# Patient Record
Sex: Male | Born: 2000 | Hispanic: No | Marital: Single | Smoking: Never smoker
Health system: Southern US, Community
[De-identification: ages and names within clinical notes are randomized; demographics above are authoritative.]

## PROBLEM LIST (undated history)

## (undated) DIAGNOSIS — J45909 Unspecified asthma, uncomplicated: Secondary | ICD-10-CM

## (undated) DIAGNOSIS — T7840XA Allergy, unspecified, initial encounter: Secondary | ICD-10-CM

## (undated) HISTORY — PX: FINGER NAIL SURGERY: SHX717

---
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
## 2023-03-21 ENCOUNTER — Telehealth: Payer: Self-pay | Admitting: Orthopedic Surgery

## 2023-03-21 ENCOUNTER — Encounter (HOSPITAL_COMMUNITY): Payer: Self-pay | Admitting: *Deleted

## 2023-03-21 ENCOUNTER — Other Ambulatory Visit: Payer: Self-pay

## 2023-03-21 ENCOUNTER — Ambulatory Visit (HOSPITAL_COMMUNITY)
Admission: EM | Admit: 2023-03-21 | Discharge: 2023-03-21 | Disposition: A | Discharge status: Home / Self Care | Payer: Medicaid Other | Attending: Family Medicine | Admitting: Family Medicine

## 2023-03-21 ENCOUNTER — Ambulatory Visit (INDEPENDENT_AMBULATORY_CARE_PROVIDER_SITE_OTHER): Payer: Medicaid Other

## 2023-03-21 DIAGNOSIS — S42022A Displaced fracture of shaft of left clavicle, initial encounter for closed fracture: Secondary | ICD-10-CM | POA: Diagnosis not present

## 2023-03-21 DIAGNOSIS — T148XXA Other injury of unspecified body region, initial encounter: Secondary | ICD-10-CM

## 2023-03-21 DIAGNOSIS — S42002A Fracture of unspecified part of left clavicle, initial encounter for closed fracture: Secondary | ICD-10-CM | POA: Diagnosis not present

## 2023-03-21 MED ORDER — TETANUS-DIPHTH-ACELL PERTUSSIS 5-2.5-18.5 LF-MCG/0.5 IM SUSY
0.5000 mL | PREFILLED_SYRINGE | Freq: Once | INTRAMUSCULAR | Status: AC
  Administered 2023-03-21: 0.5 mL via INTRAMUSCULAR

## 2023-03-21 MED ORDER — HYDROCODONE-ACETAMINOPHEN 5-325 MG PO TABS: 1.0000 | ORAL_TABLET | Freq: Four times a day (QID) | ORAL | 0 refills | Status: DC | PRN

## 2023-03-21 MED ORDER — KETOROLAC TROMETHAMINE 30 MG/ML IJ SOLN: 30.0000 mg | Freq: Once | INTRAMUSCULAR | Status: DC

## 2023-03-21 MED ORDER — TETANUS-DIPHTH-ACELL PERTUSSIS 5-2.5-18.5 LF-MCG/0.5 IM SUSY
PREFILLED_SYRINGE | INTRAMUSCULAR | Status: AC
  Filled 2023-03-21: qty 0.5
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
## 2023-03-21 NOTE — Discharge Instructions (Addendum)
You have a fracture in the middle of your left collarbone.  You may need surgery but you do not have to have it urgently.  Please call the orthopedics office to make an appointment to see them for evaluation.  You have been given a shot of Toradol 30 mg today.  You have been given a Tdap vaccination and booster tetanus immunity  Hydrocodone 5 mg--1 tablet every 6 hours as needed for pain.  This is best taken with food.  It can cause sleepiness or dizziness
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
## 2023-03-21 NOTE — Telephone Encounter (Signed)
Per Dr. Roda Shutters need sx and he wants him to come in the morning. Called and scheduled

## 2023-03-21 NOTE — Telephone Encounter (Signed)
Patient asking if he is needing surgery on his shoulder. Please advise

## 2023-03-22 ENCOUNTER — Ambulatory Visit (INDEPENDENT_AMBULATORY_CARE_PROVIDER_SITE_OTHER): Payer: Medicaid Other | Admitting: Orthopaedic Surgery

## 2023-03-22 DIAGNOSIS — S42022A Displaced fracture of shaft of left clavicle, initial encounter for closed fracture: Secondary | ICD-10-CM | POA: Diagnosis not present

## 2023-03-22 MED ORDER — HYDROCODONE-ACETAMINOPHEN 5-325 MG PO TABS: 1.0000 | ORAL_TABLET | Freq: Four times a day (QID) | ORAL | 0 refills | Status: AC | PRN
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
## 2023-03-22 NOTE — Addendum Note (Signed)
Addended by: Mayra Reel on: 03/22/2023 09:08 AM   Modules accepted: Orders

## 2023-03-23 ENCOUNTER — Other Ambulatory Visit: Payer: Self-pay

## 2023-03-23 ENCOUNTER — Encounter (HOSPITAL_COMMUNITY): Payer: Self-pay | Admitting: Orthopaedic Surgery

## 2023-03-23 NOTE — Progress Notes (Addendum)
Mr.  Steven Blanchard denies chest pain or shortness of breath.  Patient denies having any s/s of Covid in his household, also denies any known exposure to Covid.  Steven Blanchard denies  any s/s of upper or lower respiratory infection in the past 8 weeks. Steven Blanchard does not have a PCP.

## 2023-03-26 NOTE — Anesthesia Preprocedure Evaluation (Addendum)
Anesthesia Evaluation  Patient identified by MRN, date of birth, ID band Patient awake    Reviewed: Allergy & Precautions, NPO status , Patient's Chart, lab work & pertinent test results  History of Anesthesia Complications Negative for: history of anesthetic complications  Airway Mallampati: III  TM Distance: >3 FB Neck ROM: Full   Comment: Facial hair Dental  (+) Dental Advisory Given   Pulmonary neg shortness of breath, asthma (no recent flares, does not use inhalers) , neg sleep apnea, neg COPD, neg recent URI, Patient abstained from smoking.   Pulmonary exam normal breath sounds clear to auscultation       Cardiovascular negative cardio ROS  Rhythm:Regular Rate:Normal     Neuro/Psych negative neurological ROS     GI/Hepatic negative GI ROS,,,(+)     substance abuse (last use on 03/25/2023)  marijuana use  Endo/Other  negative endocrine ROS    Renal/GU negative Renal ROS     Musculoskeletal   Abdominal  (+) + obese  Peds  Hematology negative hematology ROS (+)   Anesthesia Other Findings Vapes  Reproductive/Obstetrics                             Anesthesia Physical Anesthesia Plan  ASA: 2  Anesthesia Plan: General   Post-op Pain Management: Tylenol PO (pre-op)*, Precedex and Ketamine IV*   Induction: Intravenous  PONV Risk Score and Plan: 2 and Ondansetron, Dexamethasone and Treatment may vary due to age or medical condition  Airway Management Planned: Oral ETT  Additional Equipment:   Intra-op Plan:   Post-operative Plan: Extubation in OR  Informed Consent: I have reviewed the patients History and Physical, chart, labs and discussed the procedure including the risks, benefits and alternatives for the proposed anesthesia with the patient or authorized representative who has indicated his/her understanding and acceptance.     Dental advisory given  Plan Discussed  with: CRNA and Anesthesiologist  Anesthesia Plan Comments: (Risks of general anesthesia discussed including, but not limited to, sore throat, hoarse voice, chipped/damaged teeth, injury to vocal cords, nausea and vomiting, allergic reactions, lung infection, heart attack, stroke, and death. All questions answered. )       Anesthesia Quick Evaluation

## 2023-03-27 ENCOUNTER — Other Ambulatory Visit: Payer: Self-pay

## 2023-03-27 ENCOUNTER — Ambulatory Visit (HOSPITAL_COMMUNITY): Payer: Medicaid Other

## 2023-03-27 ENCOUNTER — Ambulatory Visit (HOSPITAL_COMMUNITY)
Admission: RE | Admit: 2023-03-27 | Discharge: 2023-03-27 | Disposition: A | Discharge status: Home / Self Care | Payer: Medicaid Other | Attending: Orthopaedic Surgery | Admitting: Orthopaedic Surgery

## 2023-03-27 ENCOUNTER — Ambulatory Visit (HOSPITAL_COMMUNITY): Payer: Medicaid Other | Admitting: Anesthesiology

## 2023-03-27 ENCOUNTER — Ambulatory Visit (HOSPITAL_BASED_OUTPATIENT_CLINIC_OR_DEPARTMENT_OTHER): Payer: Medicaid Other | Admitting: Anesthesiology

## 2023-03-27 ENCOUNTER — Encounter (HOSPITAL_COMMUNITY): Payer: Self-pay | Admitting: Orthopaedic Surgery

## 2023-03-27 ENCOUNTER — Encounter (HOSPITAL_COMMUNITY)
Admission: RE | Disposition: A | Discharge status: Home / Self Care | Payer: Self-pay | Source: Home / Self Care | Attending: Orthopaedic Surgery

## 2023-03-27 DIAGNOSIS — S42002A Fracture of unspecified part of left clavicle, initial encounter for closed fracture: Secondary | ICD-10-CM | POA: Insufficient documentation

## 2023-03-27 DIAGNOSIS — J45909 Unspecified asthma, uncomplicated: Secondary | ICD-10-CM | POA: Insufficient documentation

## 2023-03-27 DIAGNOSIS — S42022A Displaced fracture of shaft of left clavicle, initial encounter for closed fracture: Secondary | ICD-10-CM

## 2023-03-27 DIAGNOSIS — X58XXXA Exposure to other specified factors, initial encounter: Secondary | ICD-10-CM | POA: Diagnosis not present

## 2023-03-27 HISTORY — PX: ORIF CLAVICULAR FRACTURE: SHX5055

## 2023-03-27 LAB — COMPREHENSIVE METABOLIC PANEL
ALT: 35 U/L (ref 0–44)
AST: 43 U/L — ABNORMAL HIGH (ref 15–41)
Albumin: 3.8 g/dL (ref 3.5–5.0)
Alkaline Phosphatase: 56 U/L (ref 38–126)
Anion gap: 9 (ref 5–15)
BUN: 11 mg/dL (ref 6–20)
CO2: 24 mmol/L (ref 22–32)
Calcium: 8.8 mg/dL — ABNORMAL LOW (ref 8.9–10.3)
Chloride: 103 mmol/L (ref 98–111)
Creatinine, Ser: 0.83 mg/dL (ref 0.61–1.24)
GFR, Estimated: >60 mL/min (ref >60)
Glucose, Bld: 93 mg/dL (ref 70–99)
Potassium: 4.3 mmol/L (ref 3.5–5.1)
Sodium: 136 mmol/L (ref 135–145)
Total Bilirubin: 1 mg/dL (ref 0.3–1.2)
Total Protein: 6.7 g/dL (ref 6.5–8.1)

## 2023-03-27 LAB — CBC
HCT: 44.9 % (ref 39.0–52.0)
Hemoglobin: 15.8 g/dL (ref 13.0–17.0)
MCH: 29.9 pg (ref 26.0–34.0)
MCHC: 35.2 g/dL (ref 30.0–36.0)
MCV: 85 fL (ref 80.0–100.0)
Platelets: 274 10*3/uL (ref 150–400)
RBC: 5.28 MIL/uL (ref 4.22–5.81)
RDW: 12.5 % (ref 11.5–15.5)
WBC: 7.3 10*3/uL (ref 4.0–10.5)
nRBC: 0 % (ref 0.0–0.2)

## 2023-03-27 SURGERY — OPEN REDUCTION INTERNAL FIXATION (ORIF) CLAVICULAR FRACTURE
Anesthesia: General | Laterality: Left

## 2023-03-27 MED ORDER — MIDAZOLAM HCL 2 MG/2ML IJ SOLN
INTRAMUSCULAR | Status: AC
  Filled 2023-03-27: qty 2

## 2023-03-27 MED ORDER — DEXMEDETOMIDINE HCL IN NACL 80 MCG/20ML IV SOLN
INTRAVENOUS | Status: DC | PRN
  Administered 2023-03-27: 8 ug via INTRAVENOUS
  Administered 2023-03-27: 4 ug via INTRAVENOUS
  Administered 2023-03-27: 8 ug via INTRAVENOUS

## 2023-03-27 MED ORDER — OXYCODONE HCL 5 MG PO TABS: 5.0000 mg | ORAL_TABLET | Freq: Once | ORAL | Status: DC | PRN

## 2023-03-27 MED ORDER — BUPIVACAINE-EPINEPHRINE 0.25% -1:200000 IJ SOLN
INTRAMUSCULAR | Status: DC | PRN
  Administered 2023-03-27: 30 mL

## 2023-03-27 MED ORDER — SUGAMMADEX SODIUM 200 MG/2ML IV SOLN
INTRAVENOUS | Status: DC | PRN
  Administered 2023-03-27: 200 mg via INTRAVENOUS

## 2023-03-27 MED ORDER — MIDAZOLAM HCL 2 MG/2ML IJ SOLN
INTRAMUSCULAR | Status: DC | PRN
  Administered 2023-03-27: 2 mg via INTRAVENOUS

## 2023-03-27 MED ORDER — HYDROMORPHONE HCL 1 MG/ML IJ SOLN
0.2500 mg | INTRAMUSCULAR | Status: DC | PRN
  Administered 2023-03-27 (×2): 0.5 mg via INTRAVENOUS

## 2023-03-27 MED ORDER — LACTATED RINGERS IV SOLN: INTRAVENOUS | Status: DC

## 2023-03-27 MED ORDER — ROCURONIUM BROMIDE 10 MG/ML (PF) SYRINGE
PREFILLED_SYRINGE | INTRAVENOUS | Status: DC | PRN
  Administered 2023-03-27: 50 mg via INTRAVENOUS
  Administered 2023-03-27: 30 mg via INTRAVENOUS

## 2023-03-27 MED ORDER — FENTANYL CITRATE (PF) 250 MCG/5ML IJ SOLN
INTRAMUSCULAR | Status: DC | PRN
  Administered 2023-03-27: 100 ug via INTRAVENOUS

## 2023-03-27 MED ORDER — ACETAMINOPHEN 500 MG PO TABS
1000.0000 mg | ORAL_TABLET | Freq: Once | ORAL | Status: AC
  Administered 2023-03-27: 1000 mg via ORAL
  Filled 2023-03-27: qty 2

## 2023-03-27 MED ORDER — 0.9 % SODIUM CHLORIDE (POUR BTL) OPTIME
TOPICAL | Status: DC | PRN
  Administered 2023-03-27: 1000 mL

## 2023-03-27 MED ORDER — PROMETHAZINE HCL 25 MG/ML IJ SOLN: 6.2500 mg | INTRAMUSCULAR | Status: DC | PRN

## 2023-03-27 MED ORDER — HYDROMORPHONE HCL 1 MG/ML IJ SOLN
INTRAMUSCULAR | Status: AC
  Filled 2023-03-27: qty 1

## 2023-03-27 MED ORDER — ONDANSETRON HCL 4 MG/2ML IJ SOLN
INTRAMUSCULAR | Status: DC | PRN
  Administered 2023-03-27: 4 mg via INTRAVENOUS

## 2023-03-27 MED ORDER — METHOCARBAMOL 750 MG PO TABS: 750.0000 mg | ORAL_TABLET | Freq: Two times a day (BID) | ORAL | 3 refills | Status: AC | PRN

## 2023-03-27 MED ORDER — OXYCODONE HCL 5 MG/5ML PO SOLN: 5.0000 mg | Freq: Once | ORAL | Status: DC | PRN

## 2023-03-27 MED ORDER — OXYCODONE-ACETAMINOPHEN 5-325 MG PO TABS: 1.0000 | ORAL_TABLET | Freq: Two times a day (BID) | ORAL | 0 refills | Status: AC | PRN

## 2023-03-27 MED ORDER — PROPOFOL 10 MG/ML IV BOLUS
INTRAVENOUS | Status: AC
  Filled 2023-03-27: qty 20

## 2023-03-27 MED ORDER — KETAMINE HCL 50 MG/5ML IJ SOSY
PREFILLED_SYRINGE | INTRAMUSCULAR | Status: AC
  Filled 2023-03-27: qty 5

## 2023-03-27 MED ORDER — FENTANYL CITRATE (PF) 250 MCG/5ML IJ SOLN
INTRAMUSCULAR | Status: AC
  Filled 2023-03-27: qty 5

## 2023-03-27 MED ORDER — BUPIVACAINE HCL (PF) 0.25 % IJ SOLN
INTRAMUSCULAR | Status: AC
  Filled 2023-03-27: qty 30

## 2023-03-27 MED ORDER — DEXAMETHASONE SODIUM PHOSPHATE 10 MG/ML IJ SOLN
INTRAMUSCULAR | Status: DC | PRN
  Administered 2023-03-27: 10 mg via INTRAVENOUS

## 2023-03-27 MED ORDER — CEFAZOLIN SODIUM-DEXTROSE 2-4 GM/100ML-% IV SOLN
2.0000 g | INTRAVENOUS | Status: AC
  Administered 2023-03-27: 2 g via INTRAVENOUS
  Filled 2023-03-27: qty 100

## 2023-03-27 MED ORDER — ORAL CARE MOUTH RINSE: 15.0000 mL | Freq: Once | OROMUCOSAL | Status: AC

## 2023-03-27 MED ORDER — CHLORHEXIDINE GLUCONATE 0.12 % MT SOLN
15.0000 mL | Freq: Once | OROMUCOSAL | Status: AC
  Administered 2023-03-27: 15 mL via OROMUCOSAL
  Filled 2023-03-27: qty 15

## 2023-03-27 MED ORDER — EPINEPHRINE PF 1 MG/ML IJ SOLN
INTRAMUSCULAR | Status: AC
  Filled 2023-03-27: qty 1

## 2023-03-27 MED ORDER — LIDOCAINE 2% (20 MG/ML) 5 ML SYRINGE
INTRAMUSCULAR | Status: DC | PRN
  Administered 2023-03-27: 60 mg via INTRAVENOUS

## 2023-03-27 MED ORDER — PROPOFOL 10 MG/ML IV BOLUS
INTRAVENOUS | Status: DC | PRN
  Administered 2023-03-27: 200 mg via INTRAVENOUS

## 2023-03-27 MED ORDER — IBUPROFEN 800 MG PO TABS: 800.0000 mg | ORAL_TABLET | Freq: Three times a day (TID) | ORAL | 2 refills | Status: AC | PRN

## 2023-03-27 SURGICAL SUPPLY — 54 items
BAG COUNTER SPONGE SURGICOUNT (BAG) ×1 IMPLANT
BAG SPNG CNTER NS LX DISP (BAG) ×1
BIT DRILL 110X30X2XCALB (BIT) IMPLANT
BIT DRILL QC 2.0X100 (BIT) ×1
BIT DRILL QC 2.5X135 (BIT) IMPLANT
BIT DRL 110X30X2XCALB (BIT) ×1
COOLER ICEMAN CLASSIC (MISCELLANEOUS) IMPLANT
COVER SURGICAL LIGHT HANDLE (MISCELLANEOUS) ×1 IMPLANT
DRAPE C-ARM 42X72 X-RAY (DRAPES) ×1 IMPLANT
DRAPE U-SHAPE 47X51 STRL (DRAPES) ×1 IMPLANT
DRAPE UNIVERSAL PACK (DRAPES) ×1 IMPLANT
DRSG TEGADERM 4X4.75 (GAUZE/BANDAGES/DRESSINGS) ×2 IMPLANT
DRSG XEROFORM 1X8 (GAUZE/BANDAGES/DRESSINGS) IMPLANT
DURAPREP 26ML APPLICATOR (WOUND CARE) ×2 IMPLANT
ELECT CAUTERY BLADE 6.4 (BLADE) ×1 IMPLANT
ELECT REM PT RETURN 9FT ADLT (ELECTROSURGICAL) ×1
ELECTRODE REM PT RTRN 9FT ADLT (ELECTROSURGICAL) ×1 IMPLANT
GAUZE SPONGE 4X4 12PLY STRL (GAUZE/BANDAGES/DRESSINGS) IMPLANT
GAUZE XEROFORM 1X8 LF (GAUZE/BANDAGES/DRESSINGS) ×1 IMPLANT
GLOVE BIOGEL PI IND STRL 7.0 (GLOVE) ×2 IMPLANT
GLOVE BIOGEL PI IND STRL 7.5 (GLOVE) ×1 IMPLANT
GLOVE ECLIPSE 7.0 STRL STRAW (GLOVE) ×1 IMPLANT
GLOVE SKINSENSE STRL SZ7.5 (GLOVE) ×2 IMPLANT
GLOVE SURG SYN 7.5  E (GLOVE) ×2
GLOVE SURG SYN 7.5 E (GLOVE) ×2 IMPLANT
GLOVE SURG SYN 7.5 PF PI (GLOVE) ×2 IMPLANT
GLOVE SURG UNDER POLY LF SZ7 (GLOVE) ×19 IMPLANT
GLOVE SURG UNDER POLY LF SZ7.5 (GLOVE) ×4 IMPLANT
GOWN STRL SURGICAL XL XLNG (GOWN DISPOSABLE) ×2 IMPLANT
KIT BASIN OR (CUSTOM PROCEDURE TRAY) ×1 IMPLANT
KIT TURNOVER KIT B (KITS) ×1 IMPLANT
MANIFOLD NEPTUNE II (INSTRUMENTS) ×1 IMPLANT
NDL HYPO 25GX1X1/2 BEV (NEEDLE) IMPLANT
NEEDLE HYPO 25GX1X1/2 BEV (NEEDLE) ×1 IMPLANT
NS IRRIG 1000ML POUR BTL (IV SOLUTION) ×1 IMPLANT
PACK SHOULDER (CUSTOM PROCEDURE TRAY) ×1 IMPLANT
PAD ARMBOARD 7.5X6 YLW CONV (MISCELLANEOUS) ×2 IMPLANT
PLATE BN 2.7 VA NS CLVCL LT SS (Plate) IMPLANT
PLATE CLAV VA LCP 2.7 LT (Plate) ×1 IMPLANT
SCREW CORTEX 2.7 SLF-TPNG 16MM (Screw) IMPLANT
SCREW LOCKING 2.7X16MM VA (Screw) IMPLANT
SCREW VA LCP 3.5X14 NS (Screw) IMPLANT
SLING ARM FOAM STRAP LRG (SOFTGOODS) IMPLANT
STRIP CLOSURE SKIN 1/2X4 (GAUZE/BANDAGES/DRESSINGS) ×1 IMPLANT
SUCTION FRAZIER HANDLE 10FR (MISCELLANEOUS) ×1
SUCTION TUBE FRAZIER 10FR DISP (MISCELLANEOUS) ×1 IMPLANT
SUT MNCRL AB 4-0 PS2 18 (SUTURE) ×1 IMPLANT
SUT MNCRL+ AB 3-0 CT1 36 (SUTURE) IMPLANT
SUT VIC AB 0 CT1 27 (SUTURE) ×1
SUT VIC AB 0 CT1 27XBRD ANBCTR (SUTURE) IMPLANT
SUT VIC AB 2-0 CT1 27 (SUTURE) ×2
SUT VIC AB 2-0 CT1 TAPERPNT 27 (SUTURE) ×1 IMPLANT
SYR CONTROL 10ML LL (SYRINGE) IMPLANT
UNDERPAD 30X36 HEAVY ABSORB (UNDERPADS AND DIAPERS) ×1 IMPLANT

## 2023-03-27 NOTE — Anesthesia Postprocedure Evaluation (Signed)
Anesthesia Post Note  Patient: Steven Blanchard  Procedure(s) Performed: OPEN REDUCTION INTERNAL FIXATION (ORIF) CLAVICULAR FRACTURE (Left)     Patient location during evaluation: PACU Anesthesia Type: General Level of consciousness: awake Pain management: pain level controlled Vital Signs Assessment: post-procedure vital signs reviewed and stable Respiratory status: spontaneous breathing, nonlabored ventilation and respiratory function stable Cardiovascular status: blood pressure returned to baseline and stable Postop Assessment: no apparent nausea or vomiting Anesthetic complications: no   There were no known notable events for this encounter.  Last Vitals:  Vitals:   03/27/23 1200 03/27/23 1215  BP: (!) 138/101 (!) 139/97  Pulse: 77 80  Resp: 20 14  Temp:  36.5 C  SpO2: 95% 93%    Last Pain:  Vitals:   03/27/23 1215  TempSrc:   PainSc: Asleep                 Linton Rump

## 2023-03-27 NOTE — Anesthesia Procedure Notes (Signed)
Procedure Name: Intubation Date/Time: 03/27/2023 9:54 AM  Performed by: Gus Puma, CRNAPre-anesthesia Checklist: Patient identified, Emergency Drugs available, Suction available and Patient being monitored Patient Re-evaluated:Patient Re-evaluated prior to induction Oxygen Delivery Method: Circle System Utilized Preoxygenation: Pre-oxygenation with 100% oxygen Induction Type: IV induction Ventilation: Mask ventilation without difficulty Laryngoscope Size: Mac and 4 Grade View: Grade I Tube type: Oral Tube size: 7.5 mm Number of attempts: 1 Airway Equipment and Method: Stylet Placement Confirmation: ETT inserted through vocal cords under direct vision, positive ETCO2 and breath sounds checked- equal and bilateral Secured at: 23 cm Tube secured with: Tape Dental Injury: Teeth and Oropharynx as per pre-operative assessment

## 2023-03-27 NOTE — Op Note (Signed)
   Date of Surgery: 03/27/2023  INDICATIONS: Steven Blanchard is a 22 y.o.-year-old male who sustained a left displaced clavicle fracture;  The patient did consent to the procedure after discussion of the risks and benefits.  PREOPERATIVE DIAGNOSIS: Left displaced clavicle shaft fracture  POSTOPERATIVE DIAGNOSIS: Same.  PROCEDURE: Open treatment of left clavicle fracture with internal fixation  SURGEON: N. Glee Arvin, M.D.  ASSIST: Oneal Grout, PA-C  ANESTHESIA:  general, local  IV FLUIDS AND URINE: See anesthesia.  ESTIMATED BLOOD LOSS: minimal mL.  IMPLANTS: Synthes superior plate 12 hole medium length  COMPLICATIONS: None.  DESCRIPTION OF PROCEDURE: The patient was brought to the operating room and placed supine on the operating table.  The patient had been signed prior to the procedure and this was documented. The patient had the anesthesia placed by the anesthesiologist.  The patient was then placed in the supine position.  A gel roll was placed under the left scapula.  All bony prominences were well padded.  A time-out was performed to confirm that this was the correct patient, site, side and location. The patient had SCDs on the lower extremities. The patient did receive antibiotics prior to the incision and was re-dosed during the procedure as needed at indicated intervals.  The patient had the operative extremity prepped and draped in the standard surgical fashion.    A horizontal incision based over the clavicle was used.  Cutaneous nerves were identified and protected as much as possible.  Full thickness muscular flaps were elevated off of the clavicle.  The fracture was exposed and mobilized.  Any organized hematoma was retrieved from the fracture site. Reduction under direct visualization was obtained using clamps and a superior plate was applied to the clavicle.  There was a small anterior butterfly fragment that was encountered.  Given the orientation of the fracture  lines, I could not achieve interfragmentary fixation.  A superior plate was chosen for a bridge construct.  The appropriate length was found by using fluoroscopy.  With the plate in the appropriate position and the fracture reduced.  Nonlocking screws were placed through the lateral 2.7 mm holes of the plate into the clavicle.  Each screw was excellent fixation.  I then placed 5 more nonlocking screws into the medial 2.7/3.5 mm holes of the plate each with excellent fixation.  I then placed 2 more locking screws through the lateral 2.7 mm holes.  Care was taken not to plunge with any of the instruments.  Retractors were used as added protection to the neurovascular and pulmonary structures.  Final fluoroscopy pictures were taken to confirm plate placement and fracture reduction.  The wound was thoroughly irrigated and closed in a layer fashion using 0 vicryl, 2.0 vicryl and 3.0 monocryl and steri strips.  Sterile dressings were applied and the patient was extubated and transferred to the PACU in stable condition.  Steven Blanchard, my PA, was a medical necessity for the entirety of the surgery including opening, closing, limb positioning, retracting, exposing, and repairing.  POSTOPERATIVE PLAN: Patient will be in a sling for comfort.  He is allowed to range his shoulder up to the level of the shoulder and not allowed to lift anything.  Observation overnight for pain control and discharge home in the morning.  Steven Reel, MD [facility] 11:13 AM

## 2023-03-27 NOTE — H&P (Signed)
    PREOPERATIVE H&P  Chief Complaint: left clavicle fracture  HPI: Steven Blanchard is a 22 y.o. male who presents for surgical treatment of left clavicle fracture.  He denies any changes in medical history.  Past Medical History:  Diagnosis Date   Allergy    Asthma    no issue since age 54   Past Surgical History:  Procedure Laterality Date   FINGER NAIL SURGERY     Social History   Socioeconomic History   Marital status: Single    Spouse name: Not on file   Number of children: Not on file   Years of education: Not on file   Highest education level: Not on file  Occupational History   Not on file  Tobacco Use   Smoking status: Never   Smokeless tobacco: Never  Vaping Use   Vaping Use: Every day  Substance and Sexual Activity   Alcohol use: No   Drug use: Yes    Frequency: 7.0 times per week    Types: Marijuana   Sexual activity: Never  Other Topics Concern   Not on file  Social History Narrative   Not on file   Social Determinants of Health   Financial Resource Strain: Not on file  Food Insecurity: Not on file  Transportation Needs: Not on file  Physical Activity: Not on file  Stress: Not on file  Social Connections: Not on file   History reviewed. No pertinent family history. Allergies  Allergen Reactions   Neomycin Other (See Comments)    Burns skin   Prior to Admission medications   Medication Sig Start Date End Date Taking? Authorizing Provider  HYDROcodone-acetaminophen (NORCO/VICODIN) 5-325 MG tablet Take 1 tablet by mouth every 6 (six) hours as needed (pain). 03/22/23  Yes Tarry Kos, MD     Positive ROS: All other systems have been reviewed and were otherwise negative with the exception of those mentioned in the HPI and as above.  Physical Exam: General: Alert, no acute distress Cardiovascular: No pedal edema Respiratory: No cyanosis, no use of accessory musculature GI: abdomen soft Skin: No lesions in the area of chief  complaint Neurologic: Sensation intact distally Psychiatric: Patient is competent for consent with normal mood and affect Lymphatic: no lymphedema  MUSCULOSKELETAL: exam stable  Assessment: left clavicle fracture  Plan: Plan for Procedure(s): OPEN REDUCTION INTERNAL FIXATION (ORIF) CLAVICULAR FRACTURE  The risks benefits and alternatives were discussed with the patient including but not limited to the risks of nonoperative treatment, versus surgical intervention including infection, bleeding, nerve injury,  blood clots, cardiopulmonary complications, morbidity, mortality, among others, and they were willing to proceed.   Glee Arvin, MD 03/27/2023 7:40 AM

## 2023-03-27 NOTE — Transfer of Care (Signed)
Immediate Anesthesia Transfer of Care Note  Patient: Steven Blanchard  Procedure(s) Performed: OPEN REDUCTION INTERNAL FIXATION (ORIF) CLAVICULAR FRACTURE (Left)  Patient Location: PACU  Anesthesia Type:General  Level of Consciousness: drowsy and patient cooperative  Airway & Oxygen Therapy: Patient Spontanous Breathing  Post-op Assessment: Report given to RN and Post -op Vital signs reviewed and stable  Post vital signs: Reviewed and stable  Last Vitals:  Vitals Value Taken Time  BP    Temp    Pulse 55 03/27/23 1144  Resp 11 03/27/23 1144  SpO2 98 % 03/27/23 1144  Vitals shown include unvalidated device data.  Last Pain:  Vitals:   03/27/23 0703  TempSrc:   PainSc: 10-Worst pain ever         Complications: There were no known notable events for this encounter.

## 2023-03-27 NOTE — Discharge Instructions (Signed)
   Postoperative instructions:  Weightbearing instructions: non weight bearing  Keep your dressing and/or splint clean and dry at all times.  You can remove your dressing on post-operative day #3 and change with a dry/sterile dressing or Band-Aids as needed thereafter.    Incision instructions:  Do not soak your incision for 3 weeks after surgery.  If the incision gets wet, pat dry and do not scrub the incision.  Pain control:  You have been given a prescription to be taken as directed for post-operative pain control.  In addition, elevate the operative extremity above the heart at all times to prevent swelling and throbbing pain.  Take over-the-counter Colace, 100mg  by mouth twice a day while taking narcotic pain medications to help prevent constipation.  Follow up appointments: 1) 14 days for suture removal and wound check with Tessa Lerner.   -------------------------------------------------------------------------------------------------------------  After Surgery Pain Control:  After your surgery, post-surgical discomfort or pain is likely. This discomfort can last several days to a few weeks. At certain times of the day your discomfort may be more intense.  Did you receive a nerve block?  A nerve block can provide pain relief for one hour to two days after your surgery. As long as the nerve block is working, you will experience little or no sensation in the area the surgeon operated on.  As the nerve block wears off, you will begin to experience pain or discomfort. It is very important that you begin taking your prescribed pain medication before the nerve block fully wears off. Treating your pain at the first sign of the block wearing off will ensure your pain is better controlled and more tolerable when full-sensation returns. Do not wait until the pain is intolerable, as the medicine will be less effective. It is better to treat pain in advance than to try and catch up.  General  Anesthesia:  If you did not receive a nerve block during your surgery, you will need to start taking your pain medication shortly after your surgery and should continue to do so as prescribed by your surgeon.  Pain Medication:  Most commonly we prescribe Vicodin and Percocet for post-operative pain. Both of these medications contain a combination of acetaminophen (Tylenol) and a narcotic to help control pain.   It takes between 30 and 45 minutes before pain medication starts to work. It is important to take your medication before your pain level gets too intense.   Nausea is a common side effect of many pain medications. You will want to eat something before taking your pain medicine to help prevent nausea.   If you are taking a prescription pain medication that contains acetaminophen, we recommend that you do not take additional over the counter acetaminophen (Tylenol).  Other pain relieving options:   Using a cold pack to ice the affected area a few times a day (15 to 20 minutes at a time) can help to relieve pain, reduce swelling and bruising.   Elevation of the affected area can also help to reduce pain and swelling.

## 2023-03-28 ENCOUNTER — Encounter (HOSPITAL_COMMUNITY): Payer: Self-pay | Admitting: Orthopaedic Surgery

## 2023-04-03 ENCOUNTER — Telehealth: Payer: Self-pay

## 2023-04-03 NOTE — Telephone Encounter (Signed)
Triage call from the patient's mom, Logan: she took the Tegaderm and gauze off the incision - 2 of the steristrips were coming loose, so she just removed them (one at each end of the incision). She wants to know if it is ok to remove the rest of the steristrips. Mom reports that there is no drainage and "it seems to be healing good." I advised her to leave the rest of the steristrips in place until her comes back in for the postop visit with Dr. Roda Shutters. I advised Mom that she can get more Tegaderm OTC to place over the incision, so the patient can bathe and clean off the betadine around the incision. Logan voiced understandiing and had no further questions at this time.

## 2023-04-11 ENCOUNTER — Ambulatory Visit (INDEPENDENT_AMBULATORY_CARE_PROVIDER_SITE_OTHER): Payer: Medicaid Other | Admitting: Physician Assistant

## 2023-04-11 ENCOUNTER — Other Ambulatory Visit (INDEPENDENT_AMBULATORY_CARE_PROVIDER_SITE_OTHER): Payer: Medicaid Other

## 2023-04-11 ENCOUNTER — Encounter: Payer: Medicaid Other | Admitting: Physician Assistant

## 2023-04-11 DIAGNOSIS — S42022A Displaced fracture of shaft of left clavicle, initial encounter for closed fracture: Secondary | ICD-10-CM

## 2023-04-11 NOTE — Progress Notes (Signed)
   Post-Op Visit Note   Patient: Steven Blanchard           Date of Birth: [birth year]           MRN: [id number] Visit Date: 04/11/2023 PCP: Pcp, No   Assessment & Plan:  Chief Complaint:  Chief Complaint  Patient presents with   Left Arm - Follow-up   Visit Diagnoses:  1. Displaced fracture of shaft of left clavicle, initial encounter for closed fracture     Plan: Patient is a pleasant 22 year old who comes in today 2 weeks status post ORIF left clavicle fracture 03/27/2023.  He has been doing okay.  He has been in a moderate amount of pain and is taking Norco.  He has been wearing his sling.  Examination of the left clavicle reveals a well-healed surgical incision without complication.  He is neurovascularly intact distally.  Today, new Steri-Strips were applied to the incision.  He may discontinue the sling.  I sent in a referral for outpatient physical therapy for pendulum swings and range of motion.  No lifting left upper extremity.  Follow-up with [place] in 4 weeks for repeat evaluation and 2 view x-rays left clavicle.  At that point, we will likely start strengthening exercises to the left upper extremity.  Call with concerns or questions in the meantime.  Follow-Up Instructions: Return in about 4 weeks (around 05/09/2023).   Orders:  Orders Placed This Encounter  Procedures   XR Clavicle Left   Ambulatory referral to Physical Therapy   No orders of the defined types were placed in this encounter.   Imaging: XR Clavicle Left  Result Date: 04/11/2023 X-rays demonstrate stable alignment of the fracture without hardware complication   PMFS History: Patient Active Problem List   Diagnosis Date Noted   Closed displaced fracture of left clavicle, initial encounter 03/27/2023   Past Medical History:  Diagnosis Date   Allergy    Asthma    no issue since age 59    No family history on file.  Past Surgical History:  Procedure Laterality Date   FINGER NAIL SURGERY     ORIF  CLAVICULAR FRACTURE Left 03/27/2023   Procedure: OPEN REDUCTION INTERNAL FIXATION (ORIF) CLAVICULAR FRACTURE;  Surgeon: Tarry Kos, MD;  Location: [facility] OR;  Service: Orthopedics;  Laterality: Left;   Social History   Occupational History   Not on file  Tobacco Use   Smoking status: Never   Smokeless tobacco: Never  Vaping Use   Vaping Use: Every day  Substance and Sexual Activity   Alcohol use: No   Drug use: Yes    Frequency: 7.0 times per week    Types: Marijuana   Sexual activity: Never

## 2023-05-09 ENCOUNTER — Encounter: Payer: Medicaid Other | Admitting: Physician Assistant

## 2023-05-19 ENCOUNTER — Other Ambulatory Visit (INDEPENDENT_AMBULATORY_CARE_PROVIDER_SITE_OTHER): Payer: Medicaid Other

## 2023-05-19 ENCOUNTER — Ambulatory Visit (INDEPENDENT_AMBULATORY_CARE_PROVIDER_SITE_OTHER): Payer: Medicaid Other | Admitting: Physician Assistant

## 2023-05-19 DIAGNOSIS — S42022A Displaced fracture of shaft of left clavicle, initial encounter for closed fracture: Secondary | ICD-10-CM

## 2023-05-19 NOTE — Progress Notes (Signed)
   Post-Op Visit Note   Patient: Steven Blanchard           Date of Birth: 2000/11/28           MRN: [id number] Visit Date: 05/19/2023 PCP: Pcp, No   Assessment & Plan:  Chief Complaint:  Chief Complaint  Patient presents with   Left Shoulder - Follow-up    Left clavicle ORIF 03/27/2023   Visit Diagnoses:  1. Displaced fracture of shaft of left clavicle, initial encounter for closed fracture     Plan: Patient is a pleasant 22 year old gentleman who comes in today approximately 7 and half weeks status post ORIF left clavicle fracture 03/27/2023.  He has been doing well.  Minimal discomfort if touching the clavicle.  Otherwise, no pain with range of motion of the shoulder.  Has not been to physical therapy.  Examination of the left shoulder reveals mild tenderness to the mid clavicle.  Painless range of motion of the shoulder.  He is neurovascular intact distally.  At this point, he is clinically and radiographically healing.  I have put in another referral for outpatient physical therapy to include strengthening exercises.  He will follow-up with [place] in 5 weeks for repeat evaluation and x-rays of the left clavicle.  Call with concerns or questions in the meantime.  Follow-Up Instructions: Return in about 5 weeks (around 06/23/2023).   Orders:  Orders Placed This Encounter  Procedures   XR Clavicle Left   Ambulatory referral to Physical Therapy   No orders of the defined types were placed in this encounter.   Imaging: XR Clavicle Left  Result Date: 05/19/2023 X-rays demonstrate continued consolidation of the fracture site.  No hardware complication.   PMFS History: Patient Active Problem List   Diagnosis Date Noted   Closed displaced fracture of left clavicle, initial encounter 03/27/2023   Past Medical History:  Diagnosis Date   Allergy    Asthma    no issue since age 61    No family history on file.  Past Surgical History:  Procedure Laterality Date   FINGER NAIL  SURGERY     ORIF CLAVICULAR FRACTURE Left 03/27/2023   Procedure: OPEN REDUCTION INTERNAL FIXATION (ORIF) CLAVICULAR FRACTURE;  Surgeon: Tarry Kos, MD;  Location: [facility] OR;  Service: Orthopedics;  Laterality: Left;   Social History   Occupational History   Not on file  Tobacco Use   Smoking status: Never   Smokeless tobacco: Never  Vaping Use   Vaping Use: Every day  Substance and Sexual Activity   Alcohol use: No   Drug use: Yes    Frequency: 7.0 times per week    Types: Marijuana   Sexual activity: Never

## 2023-06-06 NOTE — Therapy (Signed)
OUTPATIENT PHYSICAL THERAPY SHOULDER EVALUATION   Patient Name: Steven Blanchard MRN: [id number] DOB:2000-12-03, 22 y.o., male Today's Date: 06/07/2023  END OF SESSION:  PT End of Session - 06/07/23 1519     Visit Number 1    Number of Visits 7    Date for PT Re-Evaluation 07/19/23    Authorization Type [place] [facility] [employer]    PT Start Time 1443    PT Stop Time 1512    PT Time Calculation (min) 29 min    Activity Tolerance Patient tolerated treatment well    Behavior During Therapy WFL for tasks assessed/performed             Past Medical History:  Diagnosis Date   Allergy    Asthma    no issue since age 22   Past Surgical History:  Procedure Laterality Date   FINGER NAIL SURGERY     ORIF CLAVICULAR FRACTURE Left 03/27/2023   Procedure: OPEN REDUCTION INTERNAL FIXATION (ORIF) CLAVICULAR FRACTURE;  Surgeon: Tarry Kos, MD;  Location: [facility] OR;  Service: Orthopedics;  Laterality: Left;   Patient Active Problem List   Diagnosis Date Noted   Closed displaced fracture of left clavicle, initial encounter 03/27/2023    PCP: No PCP  REFERRING PROVIDER: Cristie Hem, PA-C  REFERRING DIAG: [id number] (ICD-10-CM) - Displaced fracture of shaft of left clavicle, initial encounter for closed fracture   THERAPY DIAG:  Muscle weakness (generalized) - Plan: PT plan of care cert/re-cert  Left shoulder pain, unspecified chronicity - Plan: PT plan of care cert/re-cert  Rationale for Evaluation and Treatment: Rehabilitation  ONSET DATE: 03/27/2023  SUBJECTIVE:                                                                                                                                                                                      SUBJECTIVE STATEMENT: Pt presents to PT s/p ORIF to L clavicle after fall on 03/27/2023. Has been doing well with little to no pain over last few weeks. He works heavier manual labor jobs and wants to make sure his shoulder is back to being able to  handle that load post fracture and surgery. Denies N/T down L UE.   Hand dominance: Right  PERTINENT HISTORY: None  PAIN:  Are you having pain?  No: NPRS scale: 0/10 Worst: 2/10 Location: L shoulder  PRECAUTIONS: None  RED FLAGS: None   WEIGHT BEARING RESTRICTIONS: No  FALLS:  Has patient fallen in last 6 months? Yes. 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   OBJECTIVE:   DIAGNOSTIC FINDINGS:  See imaging   PATIENT SURVEYS:  FOTO: 74% function; 79% predicted  COGNITION: Overall cognitive status: Within functional limits for tasks assessed     SENSATION: WFL  POSTURE: Rounded shoulders   UPPER EXTREMITY ROM:   Active ROM Right eval Left eval  Shoulder flexion WNL WNL  Shoulder extension WNL WNL  Shoulder abduction WNL WNL  Shoulder adduction WNL WNL  Shoulder internal rotation WNL WNL  Shoulder external rotation WNL WNL  Elbow flexion    Elbow extension    Wrist flexion    Wrist extension    Wrist ulnar deviation    Wrist radial deviation    Wrist pronation    Wrist supination    (Blank rows = not tested)  UPPER EXTREMITY MMT:  MMT Right eval Left eval  Shoulder flexion WNL 4+/5  Shoulder extension WNL 4+/5  Shoulder abduction WNL 4+/5  Shoulder adduction WNL 4+/5  Shoulder internal rotation WNL 4+/5  Shoulder external rotation WNL 4+/5  Middle trapezius    Lower trapezius    Elbow flexion    Elbow extension    Wrist flexion    Wrist extension    Wrist ulnar deviation    Wrist radial deviation    Wrist pronation    Wrist supination    Grip strength (lbs)    (Blank rows = not tested)  SHOULDER SPECIAL TESTS: Impingement tests: DNT SLAP lesions: DNT Instability tests: DNT Rotator cuff assessment:  DNT Biceps assessment: DNT  JOINT MOBILITY TESTING:  WNL  PALPATION:  Slight TTP to L anterior clavicle    TREATMENT: [facility] Adult PT Treatment:                                                DATE: 06/07/2023 Therapeutic Exercise: Row x 10 black band Seated bilateral ER GTB x 15 S/L ER x 10 3# L Supine D2 shoulder flex x 10 GTB Seated horizontal abd x 10 GTB  PATIENT EDUCATION: Education details: eval findings, FOTO, HEP, POC Person educated: Patient Education method: Explanation, Demonstration, and Handouts Education comprehension: verbalized understanding and returned demonstration  HOME EXERCISE PROGRAM: Access Code: [email or web address] URL: [email or web address] Date: 06/07/2023 Prepared by: Edwinna Areola  Exercises - Standing Shoulder Row with Anchored Resistance  - 1 x daily - 7 x weekly - 3 sets - 10 reps - black band hold - Shoulder External Rotation and Scapular Retraction with Resistance  - 1 x daily - 7 x weekly - 3 sets - 15 reps - green band hold - Sidelying Shoulder ER with Towel and Dumbbell  - 1 x daily - 7 x weekly - 3 sets - 10 reps - 3lb hold - Supine PNF D2 Flexion with Resistance  - 1 x daily - 7 x weekly - 3 sets - 10 reps - green band hold - Standing Shoulder Horizontal Abduction with Resistance  - 1 x daily - 7 x weekly - 3 sets - 10 reps - green band hold  ASSESSMENT:  CLINICAL IMPRESSION: Patient is a 22 y.o. M who was seen today for physical therapy evaluation and treatment s/p L clavicular fx and ORIF on 03/27/2023. Physical findings are consistent with referring provider impression with pt demonstrating good, normal ROM and slight decrease in strength  in L shoulder compared to R. His FOTO shows that subjectively he is operating slightly below PLOF. He would benefit from short PT episode working on improving L shoulder strength back to prior level post injury.     OBJECTIVE IMPAIRMENTS: decreased strength and impaired UE functional use.   ACTIVITY  LIMITATIONS: carrying and lifting  PARTICIPATION LIMITATIONS: occupation  PERSONAL FACTORS: Time since onset of injury/illness/exacerbation are also affecting patient's functional outcome.   REHAB POTENTIAL: Excellent  CLINICAL DECISION MAKING: Stable/uncomplicated  EVALUATION COMPLEXITY: Low   GOALS: Goals reviewed with patient? 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  Pt will self report left shoulder  pain no greater than 0/10 for improved comfort and functional ability Baseline: 2/10 at worst Goal status: INITIAL   PLAN:  PT FREQUENCY: 1x/week  PT DURATION: 6 weeks  PLANNED INTERVENTIONS: Therapeutic exercises, Therapeutic activity, Neuromuscular re-education, Balance training, Gait training, Patient/Family education, Self Care, Joint mobilization, Dry Needling, Electrical stimulation, Cryotherapy, Moist heat, Vasopneumatic device, Manual therapy, and Re-evaluation  PLAN FOR NEXT SESSION: shoulder and periscapular strengthening  [employer] Authorization   Choose one: Rehabilitative  Standardized Assessment or Functional Outcome Tool: FOTO  Score or Percent Disability: 74% function; 79% predicted  Body Parts Treated (Select each separately):  Shoulder. Overall deficits/functional  limitations for body part selected: mild N/A. Overall deficits/functional limitations for body part selected: N/A N/A. Overall deficits/functional limitations for body part selected: N/A   If treatment provided at initial evaluation, no treatment charged due to lack of authorization.    Eloy End, PT 06/07/2023, 3:25 PM

## 2023-06-07 ENCOUNTER — Other Ambulatory Visit: Payer: Self-pay

## 2023-06-07 ENCOUNTER — Ambulatory Visit: Payer: Medicaid Other | Attending: Physician Assistant

## 2023-06-07 DIAGNOSIS — M25512 Pain in left shoulder: Secondary | ICD-10-CM | POA: Insufficient documentation

## 2023-06-07 DIAGNOSIS — S42022A Displaced fracture of shaft of left clavicle, initial encounter for closed fracture: Secondary | ICD-10-CM | POA: Diagnosis not present

## 2023-06-07 DIAGNOSIS — M6281 Muscle weakness (generalized): Secondary | ICD-10-CM | POA: Insufficient documentation

## 2023-06-12 NOTE — Therapy (Unsigned)
OUTPATIENT PHYSICAL THERAPY SHOULDER EVALUATION   Patient Name: Steven Blanchard MRN: [id number] DOB:11-08-2001, 22 y.o., male Today's Date: 06/14/2023  END OF SESSION:  PT End of Session - 06/14/23 1741     Visit Number 2    Number of Visits 7    Date for PT Re-Evaluation 07/19/23    Authorization Type [employer]    PT Start Time 1740    PT Stop Time 1820    PT Time Calculation (min) 40 min    Activity Tolerance Patient tolerated treatment well    Behavior During Therapy WFL for tasks assessed/performed              Past Medical History:  Diagnosis Date   Allergy    Asthma    no issue since age 36   Past Surgical History:  Procedure Laterality Date   FINGER NAIL SURGERY     ORIF CLAVICULAR FRACTURE Left 03/27/2023   Procedure: OPEN REDUCTION INTERNAL FIXATION (ORIF) CLAVICULAR FRACTURE;  Surgeon: Tarry Kos, MD;  Location: [facility] OR;  Service: Orthopedics;  Laterality: Left;   Patient Active Problem List   Diagnosis Date Noted   Closed displaced fracture of left clavicle, initial encounter 03/27/2023    PCP: No PCP  REFERRING PROVIDER: Cristie Hem, PA-C  REFERRING DIAG: [phone] (ICD-10-CM) - Displaced fracture of shaft of left clavicle, initial encounter for closed fracture   THERAPY DIAG:  Muscle weakness (generalized)  Left shoulder pain, unspecified chronicity  Rationale for Evaluation and Treatment: Rehabilitation  ONSET DATE: 03/27/2023  SUBJECTIVE:                                                                                                                                                                                      SUBJECTIVE STATEMENT: Reports minimal L shoulder/clavicle pain   Hand dominance: Right  PERTINENT HISTORY: None  PAIN:  Are you having pain?  No: NPRS scale: 0/10 Worst: 2/10 Location: L shoulder  PRECAUTIONS: None  RED FLAGS: None   WEIGHT BEARING RESTRICTIONS: No  FALLS:  Has patient fallen in  last 6 months? Yes. 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   OBJECTIVE:   DIAGNOSTIC FINDINGS:  See imaging   PATIENT SURVEYS:  FOTO: 74% function; 79% predicted  COGNITION: Overall cognitive status: Within functional limits for tasks assessed     SENSATION: WFL  POSTURE: Rounded shoulders   UPPER EXTREMITY ROM:   Active ROM Right eval  Left eval  Shoulder flexion WNL WNL  Shoulder extension WNL WNL  Shoulder abduction WNL WNL  Shoulder adduction WNL WNL  Shoulder internal rotation WNL WNL  Shoulder external rotation WNL WNL  Elbow flexion    Elbow extension    Wrist flexion    Wrist extension    Wrist ulnar deviation    Wrist radial deviation    Wrist pronation    Wrist supination    (Blank rows = not tested)  UPPER EXTREMITY MMT:  MMT Right eval Left eval  Shoulder flexion WNL 4+/5  Shoulder extension WNL 4+/5  Shoulder abduction WNL 4+/5  Shoulder adduction WNL 4+/5  Shoulder internal rotation WNL 4+/5  Shoulder external rotation WNL 4+/5  Middle trapezius    Lower trapezius    Elbow flexion    Elbow extension    Wrist flexion    Wrist extension    Wrist ulnar deviation    Wrist radial deviation    Wrist pronation    Wrist supination    Grip strength (lbs)    (Blank rows = not tested)  SHOULDER SPECIAL TESTS: Impingement tests: DNT SLAP lesions: DNT Instability tests: DNT Rotator cuff assessment: DNT Biceps assessment: DNT  JOINT MOBILITY TESTING:  WNL  PALPATION:  Slight TTP to L anterior clavicle    TREATMENT: [facility] Adult PT Treatment:                                                DATE: 06/14/23 Therapeutic Exercise: Nustep L4 8 min Lat pull down 35# 15x2 High/Low rows 35# 15x2 Chest press 35# 2  position 15/15 Omega flyes 25# 15x2 Freemotion PNF 3# low 15/15   [facility] Adult PT Treatment:                                                DATE: 06/07/2023 Therapeutic Exercise: Row x 10 black band Seated bilateral ER GTB x 15 S/L ER x 10 3# L Supine D2 shoulder flex x 10 GTB Seated horizontal abd x 10 GTB  PATIENT EDUCATION: Education details: eval findings, FOTO, HEP, POC Person educated: Patient Education method: Explanation, Demonstration, and Handouts Education comprehension: verbalized understanding and returned demonstration  HOME EXERCISE PROGRAM: Access Code: [email or web address] URL: [email or web address] Date: 06/07/2023 Prepared by: Edwinna Areola  Exercises - Standing Shoulder Row with Anchored Resistance  - 1 x daily - 7 x weekly - 3 sets - 10 reps - black band hold - Shoulder External Rotation and Scapular Retraction with Resistance  - 1 x daily - 7 x weekly - 3 sets - 15 reps - green band hold - Sidelying Shoulder ER with Towel and Dumbbell  - 1 x daily - 7 x weekly - 3 sets - 10 reps - 3lb hold - Supine PNF D2 Flexion with Resistance  - 1 x daily - 7 x weekly - 3 sets - 10 reps - green band hold - Standing Shoulder Horizontal Abduction with Resistance  - 1 x daily - 7 x weekly - 3 sets - 10 reps - green band hold  ASSESSMENT:  CLINICAL IMPRESSION: Focus of today was L shoulder strengthening and functional training.  Added resisted isotonic strengthening as well functional training with PNF patterns.  Able to complete all tasks w/o pain or apprehension  Patient is a 22 y.o. M who was seen today for physical therapy evaluation and treatment s/p L clavicular fx and ORIF on 03/27/2023. Physical findings are consistent with referring provider impression with pt demonstrating good, normal ROM and slight decrease in strength in L shoulder compared to R. His FOTO shows that subjectively he is operating slightly below PLOF. He would benefit from short PT episode working on  improving L shoulder strength back to prior level post injury.     OBJECTIVE IMPAIRMENTS: decreased strength and impaired UE functional use.   ACTIVITY LIMITATIONS: carrying and lifting  PARTICIPATION LIMITATIONS: occupation  PERSONAL FACTORS: Time since onset of injury/illness/exacerbation are also affecting patient's functional outcome.   REHAB POTENTIAL: Excellent  CLINICAL DECISION MAKING: Stable/uncomplicated  EVALUATION COMPLEXITY: Low   GOALS: Goals reviewed with patient? 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  Pt will self report left shoulder  pain no greater than 0/10 for improved comfort and functional ability Baseline: 2/10 at worst Goal status: INITIAL   PLAN:  PT FREQUENCY: 1x/week  PT DURATION: 6 weeks  PLANNED INTERVENTIONS: Therapeutic exercises, Therapeutic activity, Neuromuscular re-education, Balance training, Gait training, Patient/Family education, Self Care, Joint mobilization, Dry Needling, Electrical stimulation, Cryotherapy, Moist heat, Vasopneumatic device, Manual therapy, and Re-evaluation  PLAN FOR NEXT SESSION: shoulder and periscapular strengthening  [employer] Authorization   Choose one: Rehabilitative  Standardized Assessment or Functional Outcome  Tool: FOTO  Score or Percent Disability: 74% function; 79% predicted  Body Parts Treated (Select each separately):  Shoulder. Overall deficits/functional limitations for body part selected: mild N/A. Overall deficits/functional limitations for body part selected: N/A N/A. Overall deficits/functional limitations for body part selected: N/A   If treatment provided at initial evaluation, no treatment charged due to lack of authorization.    Hildred Laser, PT 06/14/2023, 6:19 PM

## 2023-06-14 ENCOUNTER — Ambulatory Visit: Payer: Medicaid Other

## 2023-06-14 DIAGNOSIS — M6281 Muscle weakness (generalized): Secondary | ICD-10-CM | POA: Diagnosis not present

## 2023-06-14 DIAGNOSIS — M25512 Pain in left shoulder: Secondary | ICD-10-CM

## 2023-06-19 NOTE — Therapy (Addendum)
OUTPATIENT PHYSICAL THERAPY SHOULDER EVALUATION/DISCHARGE   Patient Name: Steven Blanchard MRN: [id number] DOB:01-11-2001, 22 y.o., male Today's Date: 01/04/2024  PHYSICAL THERAPY DISCHARGE SUMMARY  Visits from Start of Care: 6  Current functional level related to goals / functional outcomes: UTA   Remaining deficits: UTA   Education / Equipment: HEP   Patient agrees to discharge. Patient goals were partially met. Patient is being discharged due to not returning since the last visit.   Past Medical History:  Diagnosis Date   Allergy    Asthma    no issue since age 21   Past Surgical History:  Procedure Laterality Date   FINGER NAIL SURGERY     ORIF CLAVICULAR FRACTURE Left 03/27/2023   Procedure: OPEN REDUCTION INTERNAL FIXATION (ORIF) CLAVICULAR FRACTURE;  Surgeon: Tarry Kos, MD;  Location: [facility] OR;  Service: Orthopedics;  Laterality: Left;   Patient Active Problem List   Diagnosis Date Noted   Closed displaced fracture of left clavicle, initial encounter 03/27/2023    PCP: No PCP  REFERRING PROVIDER: Cristie Hem, PA-C  REFERRING DIAG: [phone] (ICD-10-CM) - Displaced fracture of shaft of left clavicle, initial encounter for closed fracture   THERAPY DIAG:  Muscle weakness (generalized)  Left shoulder pain, unspecified chronicity  Rationale for Evaluation and Treatment: Rehabilitation  ONSET DATE: 03/27/2023  SUBJECTIVE:                                                                                                                                                                                      SUBJECTIVE STATEMENT: Reports minimal L shoulder/clavicle pain   Hand dominance: Right  PERTINENT HISTORY: None  PAIN:  Are you having pain?  No: NPRS scale: 0/10 Worst: 2/10 Location: L shoulder  PRECAUTIONS: None  RED FLAGS: None   WEIGHT BEARING RESTRICTIONS: No  FALLS:  Has patient fallen in last 6 months? Yes. 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   OBJECTIVE:   DIAGNOSTIC FINDINGS:  See imaging   PATIENT SURVEYS:  FOTO: 74% function; 79% predicted  COGNITION: Overall cognitive status: Within functional limits for tasks assessed     SENSATION: WFL  POSTURE: Rounded shoulders   UPPER EXTREMITY ROM:   Active ROM Right eval Left eval  Shoulder flexion WNL WNL  Shoulder extension WNL WNL  Shoulder abduction WNL WNL  Shoulder adduction WNL WNL  Shoulder internal rotation WNL WNL  Shoulder external rotation WNL WNL  Elbow flexion  Elbow extension    Wrist flexion    Wrist extension    Wrist ulnar deviation    Wrist radial deviation    Wrist pronation    Wrist supination    (Blank rows = not tested)  UPPER EXTREMITY MMT:  MMT Right eval Left eval  Shoulder flexion WNL 4+/5  Shoulder extension WNL 4+/5  Shoulder abduction WNL 4+/5  Shoulder adduction WNL 4+/5  Shoulder internal rotation WNL 4+/5  Shoulder external rotation WNL 4+/5  Middle trapezius    Lower trapezius    Elbow flexion    Elbow extension    Wrist flexion    Wrist extension    Wrist ulnar deviation    Wrist radial deviation    Wrist pronation    Wrist supination    Grip strength (lbs)    (Blank rows = not tested)  SHOULDER SPECIAL TESTS: Impingement tests: DNT SLAP lesions: DNT Instability tests: DNT Rotator cuff assessment: DNT Biceps assessment: DNT  JOINT MOBILITY TESTING:  WNL  PALPATION:  Slight TTP to L anterior clavicle    TREATMENT: [facility] Adult PT Treatment:                                                DATE: 06/20/23 Therapeutic Exercise: Nustep L5 8 min Lat pull down 45# 15x2 (different grip positions) High/Low rows 45# 15x2 Chest press 45# 2 position  15/15 Omega flyes 35# 15x2 Freemotion PNF D1, D2 F/E 3# low 15/15 10# high 15/15 B UE stepping on/off aerobic step A/P, M/L 60s ea. In quadruped Wall pushups with black P-ball 15x  [facility] Adult PT Treatment:                                                DATE: 06/14/23 Therapeutic Exercise: Nustep L4 8 min Lat pull down 35# 15x2 High/Low rows 35# 15x2 Chest press 35# 2 position 15/15 Omega flyes 25# 15x2 Freemotion PNF 3# low 15/15   [facility] Adult PT Treatment:                                                DATE: 06/07/2023 Therapeutic Exercise: Row x 10 black band Seated bilateral ER GTB x 15 S/L ER x 10 3# L Supine D2 shoulder flex x 10 GTB Seated horizontal abd x 10 GTB  PATIENT EDUCATION: Education details: eval findings, FOTO, HEP, POC Person educated: Patient Education method: Explanation, Demonstration, and Handouts Education comprehension: verbalized understanding and returned demonstration  HOME EXERCISE PROGRAM: Access Code: [email or web address] URL: [email or web address] Date: 06/07/2023 Prepared by: Edwinna Areola  Exercises - Standing Shoulder Row with Anchored Resistance  - 1 x daily - 7 x weekly - 3 sets - 10 reps - black band hold - Shoulder External Rotation and Scapular Retraction with Resistance  - 1 x daily - 7 x weekly - 3 sets - 15 reps - green band hold - Sidelying Shoulder ER with Towel and Dumbbell  - 1 x daily - 7 x weekly - 3 sets - 10 reps - 3lb hold - Supine PNF D2 Flexion with Resistance  -  1 x daily - 7 x weekly - 3 sets - 10 reps - green band hold - Standing Shoulder Horizontal Abduction with Resistance  - 1 x daily - 7 x weekly - 3 sets - 10 reps - green band hold  ASSESSMENT:  CLINICAL IMPRESSION: Continued shoulder strengthening to assess ability to return to work related tasks and functional lifting.  Resistance and weight increased as noted.  Added additional PNF patterns from high pulley  Patient is a 22 y.o. M who was seen today for  physical therapy evaluation and treatment s/p L clavicular fx and ORIF on 03/27/2023. Physical findings are consistent with referring provider impression with pt demonstrating good, normal ROM and slight decrease in strength in L shoulder compared to R. His FOTO shows that subjectively he is operating slightly below PLOF. He would benefit from short PT episode working on improving L shoulder strength back to prior level post injury.     OBJECTIVE IMPAIRMENTS: decreased strength and impaired UE functional use.   ACTIVITY LIMITATIONS: carrying and lifting  PARTICIPATION LIMITATIONS: occupation  PERSONAL FACTORS: Time since onset of injury/illness/exacerbation are also affecting patient's functional outcome.   REHAB POTENTIAL: Excellent  CLINICAL DECISION MAKING: Stable/uncomplicated  EVALUATION COMPLEXITY: Low   GOALS: Goals reviewed with patient? 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  Pt will self report left shoulder  pain no greater than 0/10 for improved comfort and functional ability Baseline: 2/10 at worst Goal status: INITIAL   PLAN:  PT FREQUENCY: 1x/week  PT DURATION: 6 weeks  PLANNED INTERVENTIONS: Therapeutic exercises, Therapeutic activity, Neuromuscular re-education, Balance  training, Gait training, Patient/Family education, Self Care, Joint mobilization, Dry Needling, Electrical stimulation, Cryotherapy, Moist heat, Vasopneumatic device, Manual therapy, and Re-evaluation  PLAN FOR NEXT SESSION: shoulder and periscapular strengthening  [employer] Authorization   Choose one: Rehabilitative  Standardized Assessment or Functional Outcome Tool: FOTO  Score or Percent Disability: 74% function; 79% predicted  Body Parts Treated (Select each separately):  Shoulder. Overall deficits/functional limitations for body part selected: mild N/A. Overall deficits/functional limitations for body part selected: N/A N/A. Overall deficits/functional limitations for body part selected: N/A   If treatment provided at initial evaluation, no treatment charged due to lack of authorization.    Hildred Laser, PT 01/04/2024, 4:36 PM

## 2023-06-20 ENCOUNTER — Ambulatory Visit: Payer: Medicaid Other

## 2023-06-20 DIAGNOSIS — M6281 Muscle weakness (generalized): Secondary | ICD-10-CM | POA: Diagnosis not present

## 2023-06-20 DIAGNOSIS — M25512 Pain in left shoulder: Secondary | ICD-10-CM

## 2023-06-26 NOTE — Therapy (Deleted)
OUTPATIENT PHYSICAL THERAPY SHOULDER EVALUATION   Patient Name: Steven Blanchard MRN: [id number] DOB:[birth year], 22 y.o., male Today's Date: 06/26/2023  END OF SESSION:      Past Medical History:  Diagnosis Date   Allergy    Asthma    no issue since age 81   Past Surgical History:  Procedure Laterality Date   FINGER NAIL SURGERY     ORIF CLAVICULAR FRACTURE Left 03/27/2023   Procedure: OPEN REDUCTION INTERNAL FIXATION (ORIF) CLAVICULAR FRACTURE;  Surgeon: Tarry Kos, MD;  Location: [facility] OR;  Service: Orthopedics;  Laterality: Left;   Patient Active Problem List   Diagnosis Date Noted   Closed displaced fracture of left clavicle, initial encounter 03/27/2023    PCP: No PCP  REFERRING PROVIDER: Cristie Hem, PA-C  REFERRING DIAG: [phone] (ICD-10-CM) - Displaced fracture of shaft of left clavicle, initial encounter for closed fracture   THERAPY DIAG:  No diagnosis found.  Rationale for Evaluation and Treatment: Rehabilitation  ONSET DATE: 03/27/2023  SUBJECTIVE:                                                                                                                                                                                      SUBJECTIVE STATEMENT: Reports minimal L shoulder/clavicle pain   Hand dominance: Right  PERTINENT HISTORY: None  PAIN:  Are you having pain?  No: NPRS scale: 0/10 Worst: 2/10 Location: L shoulder  PRECAUTIONS: None  RED FLAGS: None   WEIGHT BEARING RESTRICTIONS: No  FALLS:  Has patient fallen in last 6 months? Yes. 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   OBJECTIVE:   DIAGNOSTIC FINDINGS:  See imaging   PATIENT SURVEYS:  FOTO: 74% function; 79%  predicted  COGNITION: Overall cognitive status: Within functional limits for tasks assessed     SENSATION: WFL  POSTURE: Rounded shoulders   UPPER EXTREMITY ROM:   Active ROM Right eval Left eval  Shoulder flexion WNL WNL  Shoulder extension WNL WNL  Shoulder abduction WNL WNL  Shoulder adduction WNL WNL  Shoulder internal rotation WNL WNL  Shoulder external rotation WNL WNL  Elbow flexion    Elbow extension    Wrist flexion    Wrist extension    Wrist ulnar deviation    Wrist radial deviation    Wrist pronation    Wrist supination    (Blank rows = not tested)  UPPER EXTREMITY MMT:  MMT Right eval Left eval  Shoulder  flexion WNL 4+/5  Shoulder extension WNL 4+/5  Shoulder abduction WNL 4+/5  Shoulder adduction WNL 4+/5  Shoulder internal rotation WNL 4+/5  Shoulder external rotation WNL 4+/5  Middle trapezius    Lower trapezius    Elbow flexion    Elbow extension    Wrist flexion    Wrist extension    Wrist ulnar deviation    Wrist radial deviation    Wrist pronation    Wrist supination    Grip strength (lbs)    (Blank rows = not tested)  SHOULDER SPECIAL TESTS: Impingement tests: DNT SLAP lesions: DNT Instability tests: DNT Rotator cuff assessment: DNT Biceps assessment: DNT  JOINT MOBILITY TESTING:  WNL  PALPATION:  Slight TTP to L anterior clavicle    TREATMENT: [facility] Adult PT Treatment:                                                DATE: 06/20/23 Therapeutic Exercise: Nustep L5 8 min Lat pull down 45# 15x2 (different grip positions) High/Low rows 45# 15x2 Chest press 45# 2 position 15/15 Omega flyes 35# 15x2 Freemotion PNF D1, D2 F/E 3# low 15/15 10# high 15/15 B UE stepping on/off aerobic step A/P, M/L 60s ea. In quadruped Wall pushups with black P-ball 15x  [facility] Adult PT Treatment:                                                DATE: 06/14/23 Therapeutic Exercise: Nustep L4 8 min Lat pull down 35# 15x2 High/Low rows 35#  15x2 Chest press 35# 2 position 15/15 Omega flyes 25# 15x2 Freemotion PNF 3# low 15/15   [facility] Adult PT Treatment:                                                DATE: 06/07/2023 Therapeutic Exercise: Row x 10 black band Seated bilateral ER GTB x 15 S/L ER x 10 3# L Supine D2 shoulder flex x 10 GTB Seated horizontal abd x 10 GTB  PATIENT EDUCATION: Education details: eval findings, FOTO, HEP, POC Person educated: Patient Education method: Explanation, Demonstration, and Handouts Education comprehension: verbalized understanding and returned demonstration  HOME EXERCISE PROGRAM: Access Code: [email or web address] URL: [email or web address] Date: 06/07/2023 Prepared by: Edwinna Areola  Exercises - Standing Shoulder Row with Anchored Resistance  - 1 x daily - 7 x weekly - 3 sets - 10 reps - black band hold - Shoulder External Rotation and Scapular Retraction with Resistance  - 1 x daily - 7 x weekly - 3 sets - 15 reps - green band hold - Sidelying Shoulder ER with Towel and Dumbbell  - 1 x daily - 7 x weekly - 3 sets - 10 reps - 3lb hold - Supine PNF D2 Flexion with Resistance  - 1 x daily - 7 x weekly - 3 sets - 10 reps - green band hold - Standing Shoulder Horizontal Abduction with Resistance  - 1 x daily - 7 x weekly - 3 sets - 10 reps - green band hold  ASSESSMENT:  CLINICAL IMPRESSION: Continued shoulder strengthening to assess  ability to return to work related tasks and functional lifting.  Resistance and weight increased as noted.  Added additional PNF patterns from high pulley  Patient is a 22 y.o. M who was seen today for physical therapy evaluation and treatment s/p L clavicular fx and ORIF on 03/27/2023. Physical findings are consistent with referring provider impression with pt demonstrating good, normal ROM and slight decrease in strength in L shoulder compared to R. His FOTO shows that subjectively he is operating slightly below PLOF. He would benefit from short PT episode  working on improving L shoulder strength back to prior level post injury.     OBJECTIVE IMPAIRMENTS: decreased strength and impaired UE functional use.   ACTIVITY LIMITATIONS: carrying and lifting  PARTICIPATION LIMITATIONS: occupation  PERSONAL FACTORS: Time since onset of injury/illness/exacerbation are also affecting patient's functional outcome.   REHAB POTENTIAL: Excellent  CLINICAL DECISION MAKING: Stable/uncomplicated  EVALUATION COMPLEXITY: Low   GOALS: Goals reviewed with patient? 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  Pt will self report left shoulder  pain no greater than 0/10 for improved comfort and functional ability Baseline: 2/10 at worst Goal status: INITIAL   PLAN:  PT FREQUENCY: 1x/week  PT DURATION: 6 weeks  PLANNED INTERVENTIONS: Therapeutic exercises, Therapeutic activity, Neuromuscular re-education, Balance training, Gait training, Patient/Family education, Self Care, Joint mobilization, Dry Needling, Electrical stimulation, Cryotherapy, Moist heat, Vasopneumatic device, Manual therapy, and Re-evaluation  PLAN FOR NEXT SESSION: shoulder and periscapular strengthening  [employer] Authorization   Choose one: Rehabilitative  Standardized Assessment or Functional  Outcome Tool: FOTO  Score or Percent Disability: 74% function; 79% predicted  Body Parts Treated (Select each separately):  Shoulder. Overall deficits/functional limitations for body part selected: mild N/A. Overall deficits/functional limitations for body part selected: N/A N/A. Overall deficits/functional limitations for body part selected: N/A   If treatment provided at initial evaluation, no treatment charged due to lack of authorization.    Hildred Laser, PT 06/26/2023, 1:21 PM

## 2023-06-28 ENCOUNTER — Telehealth: Payer: Self-pay

## 2023-06-28 ENCOUNTER — Ambulatory Visit: Payer: Medicaid Other | Attending: Physician Assistant

## 2023-06-28 NOTE — Telephone Encounter (Signed)
TC due to missed visit.  VM left for patient to call front office to schedule an additional visit
